# Patient Record
Sex: Female | Born: 1965 | Race: White | Hispanic: No | Marital: Married | Smoking: Never smoker
Health system: Southern US, Community
[De-identification: ages and names within clinical notes are randomized; demographics above are authoritative.]

## PROBLEM LIST (undated history)

## (undated) HISTORY — PX: BREAST CYST ASPIRATION: SHX578

---

## 1997-09-19 ENCOUNTER — Inpatient Hospital Stay (HOSPITAL_COMMUNITY): Admission: AD | Admit: 1997-09-19 | Discharge: 1997-09-21 | Payer: Self-pay | Admitting: Obstetrics and Gynecology

## 1997-10-16 ENCOUNTER — Other Ambulatory Visit: Admission: RE | Admit: 1997-10-16 | Discharge: 1997-10-16 | Payer: Self-pay | Admitting: Obstetrics and Gynecology

## 1998-12-20 ENCOUNTER — Other Ambulatory Visit: Admission: RE | Admit: 1998-12-20 | Discharge: 1998-12-20 | Payer: Self-pay | Admitting: Obstetrics and Gynecology

## 2000-02-13 ENCOUNTER — Other Ambulatory Visit: Admission: RE | Admit: 2000-02-13 | Discharge: 2000-02-13 | Payer: Self-pay | Admitting: Obstetrics and Gynecology

## 2001-03-07 ENCOUNTER — Other Ambulatory Visit: Admission: RE | Admit: 2001-03-07 | Discharge: 2001-03-07 | Payer: Self-pay | Admitting: Obstetrics and Gynecology

## 2001-07-08 ENCOUNTER — Ambulatory Visit (HOSPITAL_COMMUNITY): Admission: RE | Admit: 2001-07-08 | Discharge: 2001-07-08 | Payer: Self-pay | Admitting: Family Medicine

## 2001-07-08 ENCOUNTER — Encounter: Payer: Self-pay | Admitting: Family Medicine

## 2001-09-23 ENCOUNTER — Encounter: Payer: Self-pay | Admitting: Family Medicine

## 2001-09-23 ENCOUNTER — Ambulatory Visit (HOSPITAL_COMMUNITY): Admission: RE | Admit: 2001-09-23 | Discharge: 2001-09-23 | Payer: Self-pay | Admitting: Family Medicine

## 2001-10-12 ENCOUNTER — Encounter: Payer: Self-pay | Admitting: Urology

## 2001-10-12 ENCOUNTER — Encounter: Admission: RE | Admit: 2001-10-12 | Discharge: 2001-10-12 | Payer: Self-pay | Admitting: Urology

## 2001-10-27 ENCOUNTER — Encounter: Payer: Self-pay | Admitting: Urology

## 2001-10-27 ENCOUNTER — Ambulatory Visit (HOSPITAL_BASED_OUTPATIENT_CLINIC_OR_DEPARTMENT_OTHER): Admission: RE | Admit: 2001-10-27 | Discharge: 2001-10-27 | Payer: Self-pay | Admitting: Urology

## 2002-06-30 ENCOUNTER — Other Ambulatory Visit: Admission: RE | Admit: 2002-06-30 | Discharge: 2002-06-30 | Payer: Self-pay | Admitting: Obstetrics and Gynecology

## 2003-09-18 ENCOUNTER — Other Ambulatory Visit: Admission: RE | Admit: 2003-09-18 | Discharge: 2003-09-18 | Payer: Self-pay | Admitting: Obstetrics and Gynecology

## 2003-10-12 ENCOUNTER — Emergency Department (HOSPITAL_COMMUNITY): Admission: EM | Admit: 2003-10-12 | Discharge: 2003-10-12 | Payer: Self-pay | Admitting: Emergency Medicine

## 2003-10-19 DIAGNOSIS — D229 Melanocytic nevi, unspecified: Secondary | ICD-10-CM

## 2003-10-19 HISTORY — DX: Melanocytic nevi, unspecified: D22.9

## 2005-01-30 ENCOUNTER — Other Ambulatory Visit: Admission: RE | Admit: 2005-01-30 | Discharge: 2005-01-30 | Payer: Self-pay | Admitting: Obstetrics and Gynecology

## 2005-02-16 ENCOUNTER — Encounter: Admission: RE | Admit: 2005-02-16 | Discharge: 2005-02-16 | Payer: Self-pay | Admitting: Obstetrics and Gynecology

## 2005-08-28 ENCOUNTER — Encounter: Admission: RE | Admit: 2005-08-28 | Discharge: 2005-08-28 | Payer: Self-pay | Admitting: Obstetrics and Gynecology

## 2006-02-17 ENCOUNTER — Encounter: Admission: RE | Admit: 2006-02-17 | Discharge: 2006-02-17 | Payer: Self-pay | Admitting: Obstetrics and Gynecology

## 2007-05-13 ENCOUNTER — Encounter: Admission: RE | Admit: 2007-05-13 | Discharge: 2007-05-13 | Payer: Self-pay | Admitting: Obstetrics and Gynecology

## 2008-04-13 ENCOUNTER — Ambulatory Visit: Payer: Self-pay | Admitting: Urology

## 2008-05-18 ENCOUNTER — Encounter: Admission: RE | Admit: 2008-05-18 | Discharge: 2008-05-18 | Payer: Self-pay | Admitting: Obstetrics and Gynecology

## 2009-03-22 ENCOUNTER — Encounter: Admission: RE | Admit: 2009-03-22 | Discharge: 2009-03-22 | Payer: Self-pay | Admitting: Obstetrics and Gynecology

## 2009-04-19 ENCOUNTER — Ambulatory Visit: Payer: Self-pay | Admitting: Urology

## 2009-05-24 ENCOUNTER — Encounter: Admission: RE | Admit: 2009-05-24 | Discharge: 2009-05-24 | Payer: Self-pay | Admitting: Obstetrics and Gynecology

## 2010-04-05 ENCOUNTER — Encounter: Payer: Self-pay | Admitting: Obstetrics and Gynecology

## 2010-05-09 ENCOUNTER — Other Ambulatory Visit: Payer: Self-pay | Admitting: Obstetrics and Gynecology

## 2010-05-09 DIAGNOSIS — Z1231 Encounter for screening mammogram for malignant neoplasm of breast: Secondary | ICD-10-CM

## 2010-05-30 ENCOUNTER — Ambulatory Visit
Admission: RE | Admit: 2010-05-30 | Discharge: 2010-05-30 | Disposition: A | Discharge status: Home / Self Care | Payer: BC Managed Care – PPO | Source: Ambulatory Visit | Attending: Obstetrics and Gynecology | Admitting: Obstetrics and Gynecology

## 2010-05-30 DIAGNOSIS — Z1231 Encounter for screening mammogram for malignant neoplasm of breast: Secondary | ICD-10-CM

## 2010-06-02 ENCOUNTER — Other Ambulatory Visit: Payer: Self-pay | Admitting: Obstetrics and Gynecology

## 2010-06-02 DIAGNOSIS — R928 Other abnormal and inconclusive findings on diagnostic imaging of breast: Secondary | ICD-10-CM

## 2010-06-06 ENCOUNTER — Ambulatory Visit
Admission: RE | Admit: 2010-06-06 | Discharge: 2010-06-06 | Disposition: A | Discharge status: Home / Self Care | Payer: BC Managed Care – PPO | Source: Ambulatory Visit | Attending: Obstetrics and Gynecology | Admitting: Obstetrics and Gynecology

## 2010-06-06 DIAGNOSIS — R928 Other abnormal and inconclusive findings on diagnostic imaging of breast: Secondary | ICD-10-CM

## 2011-05-06 ENCOUNTER — Other Ambulatory Visit: Payer: Self-pay | Admitting: Obstetrics and Gynecology

## 2011-05-06 DIAGNOSIS — Z1231 Encounter for screening mammogram for malignant neoplasm of breast: Secondary | ICD-10-CM

## 2011-06-03 ENCOUNTER — Ambulatory Visit
Admission: RE | Admit: 2011-06-03 | Discharge: 2011-06-03 | Disposition: A | Discharge status: Home / Self Care | Payer: BC Managed Care – PPO | Source: Ambulatory Visit | Attending: Obstetrics and Gynecology | Admitting: Obstetrics and Gynecology

## 2011-06-03 DIAGNOSIS — Z1231 Encounter for screening mammogram for malignant neoplasm of breast: Secondary | ICD-10-CM

## 2012-06-15 ENCOUNTER — Other Ambulatory Visit: Payer: Self-pay

## 2012-06-15 DIAGNOSIS — Z1231 Encounter for screening mammogram for malignant neoplasm of breast: Secondary | ICD-10-CM

## 2012-07-20 ENCOUNTER — Ambulatory Visit
Admission: RE | Admit: 2012-07-20 | Discharge: 2012-07-20 | Disposition: A | Discharge status: Home / Self Care | Payer: BC Managed Care – PPO | Source: Ambulatory Visit

## 2012-07-20 DIAGNOSIS — Z1231 Encounter for screening mammogram for malignant neoplasm of breast: Secondary | ICD-10-CM

## 2013-11-03 ENCOUNTER — Other Ambulatory Visit: Payer: Self-pay

## 2013-11-03 DIAGNOSIS — Z1231 Encounter for screening mammogram for malignant neoplasm of breast: Secondary | ICD-10-CM

## 2013-11-29 ENCOUNTER — Ambulatory Visit: Payer: BC Managed Care – PPO

## 2014-01-26 ENCOUNTER — Ambulatory Visit
Admission: RE | Admit: 2014-01-26 | Discharge: 2014-01-26 | Disposition: A | Discharge status: Home / Self Care | Payer: BC Managed Care – PPO | Source: Ambulatory Visit

## 2014-01-26 DIAGNOSIS — Z1231 Encounter for screening mammogram for malignant neoplasm of breast: Secondary | ICD-10-CM

## 2014-01-29 ENCOUNTER — Other Ambulatory Visit: Payer: Self-pay | Admitting: Obstetrics and Gynecology

## 2014-01-29 DIAGNOSIS — R928 Other abnormal and inconclusive findings on diagnostic imaging of breast: Secondary | ICD-10-CM

## 2014-02-14 ENCOUNTER — Ambulatory Visit
Admission: RE | Admit: 2014-02-14 | Discharge: 2014-02-14 | Disposition: A | Discharge status: Home / Self Care | Payer: BC Managed Care – PPO | Source: Ambulatory Visit | Attending: Obstetrics and Gynecology | Admitting: Obstetrics and Gynecology

## 2014-02-14 DIAGNOSIS — R928 Other abnormal and inconclusive findings on diagnostic imaging of breast: Secondary | ICD-10-CM

## 2015-06-07 ENCOUNTER — Other Ambulatory Visit: Payer: Self-pay

## 2015-06-07 DIAGNOSIS — Z1231 Encounter for screening mammogram for malignant neoplasm of breast: Secondary | ICD-10-CM

## 2015-07-05 ENCOUNTER — Ambulatory Visit
Admission: RE | Admit: 2015-07-05 | Discharge: 2015-07-05 | Disposition: A | Discharge status: Home / Self Care | Payer: BLUE CROSS/BLUE SHIELD | Source: Ambulatory Visit

## 2015-07-05 DIAGNOSIS — Z1231 Encounter for screening mammogram for malignant neoplasm of breast: Secondary | ICD-10-CM

## 2016-08-26 ENCOUNTER — Other Ambulatory Visit: Payer: Self-pay | Admitting: Obstetrics and Gynecology

## 2016-08-26 DIAGNOSIS — Z1231 Encounter for screening mammogram for malignant neoplasm of breast: Secondary | ICD-10-CM

## 2016-08-28 ENCOUNTER — Ambulatory Visit
Admission: RE | Admit: 2016-08-28 | Discharge: 2016-08-28 | Disposition: A | Discharge status: Home / Self Care | Payer: BLUE CROSS/BLUE SHIELD | Source: Ambulatory Visit | Attending: Obstetrics and Gynecology | Admitting: Obstetrics and Gynecology

## 2016-08-28 DIAGNOSIS — Z1231 Encounter for screening mammogram for malignant neoplasm of breast: Secondary | ICD-10-CM

## 2017-10-01 ENCOUNTER — Other Ambulatory Visit: Payer: Self-pay | Admitting: Obstetrics and Gynecology

## 2017-10-01 DIAGNOSIS — Z1231 Encounter for screening mammogram for malignant neoplasm of breast: Secondary | ICD-10-CM

## 2017-10-27 ENCOUNTER — Ambulatory Visit
Admission: RE | Admit: 2017-10-27 | Discharge: 2017-10-27 | Disposition: A | Discharge status: Home / Self Care | Payer: BLUE CROSS/BLUE SHIELD | Source: Ambulatory Visit | Attending: Obstetrics and Gynecology | Admitting: Obstetrics and Gynecology

## 2017-10-27 DIAGNOSIS — Z1231 Encounter for screening mammogram for malignant neoplasm of breast: Secondary | ICD-10-CM

## 2018-08-30 ENCOUNTER — Telehealth: Payer: Self-pay

## 2018-08-30 NOTE — Telephone Encounter (Signed)
Error

## 2019-06-19 ENCOUNTER — Other Ambulatory Visit: Payer: Self-pay | Admitting: Obstetrics and Gynecology

## 2019-06-19 DIAGNOSIS — Z1231 Encounter for screening mammogram for malignant neoplasm of breast: Secondary | ICD-10-CM

## 2019-06-28 ENCOUNTER — Other Ambulatory Visit: Payer: Self-pay

## 2019-06-28 ENCOUNTER — Ambulatory Visit
Admission: RE | Admit: 2019-06-28 | Discharge: 2019-06-28 | Disposition: A | Discharge status: Home / Self Care | Payer: BC Managed Care – PPO | Source: Ambulatory Visit | Attending: Obstetrics and Gynecology | Admitting: Obstetrics and Gynecology

## 2019-06-28 DIAGNOSIS — Z1231 Encounter for screening mammogram for malignant neoplasm of breast: Secondary | ICD-10-CM

## 2019-06-29 ENCOUNTER — Other Ambulatory Visit: Payer: Self-pay | Admitting: Obstetrics and Gynecology

## 2019-06-29 DIAGNOSIS — R928 Other abnormal and inconclusive findings on diagnostic imaging of breast: Secondary | ICD-10-CM

## 2019-07-05 ENCOUNTER — Ambulatory Visit: Payer: BC Managed Care – PPO

## 2019-07-05 ENCOUNTER — Ambulatory Visit
Admission: RE | Admit: 2019-07-05 | Discharge: 2019-07-05 | Disposition: A | Discharge status: Home / Self Care | Payer: BC Managed Care – PPO | Source: Ambulatory Visit | Attending: Obstetrics and Gynecology | Admitting: Obstetrics and Gynecology

## 2019-07-05 ENCOUNTER — Other Ambulatory Visit: Payer: Self-pay

## 2019-07-05 DIAGNOSIS — R928 Other abnormal and inconclusive findings on diagnostic imaging of breast: Secondary | ICD-10-CM

## 2019-08-04 ENCOUNTER — Encounter: Payer: Self-pay | Admitting: Physician Assistant

## 2019-08-04 ENCOUNTER — Other Ambulatory Visit: Payer: Self-pay

## 2019-08-04 ENCOUNTER — Ambulatory Visit: Payer: BC Managed Care – PPO | Admitting: Physician Assistant

## 2019-08-04 DIAGNOSIS — D2339 Other benign neoplasm of skin of other parts of face: Secondary | ICD-10-CM | POA: Diagnosis not present

## 2019-08-04 DIAGNOSIS — L578 Other skin changes due to chronic exposure to nonionizing radiation: Secondary | ICD-10-CM

## 2019-08-04 DIAGNOSIS — D239 Other benign neoplasm of skin, unspecified: Secondary | ICD-10-CM

## 2019-08-04 DIAGNOSIS — Z86018 Personal history of other benign neoplasm: Secondary | ICD-10-CM | POA: Diagnosis not present

## 2019-08-04 DIAGNOSIS — Z1283 Encounter for screening for malignant neoplasm of skin: Secondary | ICD-10-CM | POA: Diagnosis not present

## 2019-08-04 NOTE — Patient Instructions (Signed)

## 2019-08-04 NOTE — Progress Notes (Signed)
   Follow-Up Visit   Subjective  Wendy Bradshaw is a 54 y.o. female who presents for the following: Skin Problem (spot has come back outer right eye and also needs skin check).   The following portions of the chart were reviewed this encounter and updated as appropriate:     Objective  Well appearing patient in no apparent distress; mood and affect are within normal limits.  A full examination was performed including scalp, head, eyes, ears, nose, lips, neck, chest, axillae, abdomen, back, buttocks, bilateral upper extremities, bilateral lower extremities, hands, feet, fingers, toes, fingernails, and toenails. All findings within normal limits unless otherwise noted below.  Objective  Chest - Medial [facility]), Left Eye, Left Upper Arm - Anterior, Mid Forehead, Right Upper Arm - Anterior: Diffuse xerosis  Objective  Left Proximal 4th Finger: All scars clear  Objective  Right Temple: Translucent raised soft papule 5-0 x2 suture nylon  Images    Objective  Head - to toe: No DN or signs of NMSC   Assessment & Plan  Actinic skin damage (5) Left Eye; Left Upper Arm - Anterior; Right Upper Arm - Anterior; Chest - Medial [facility]); Mid Forehead  observe  History of dysplastic nevus Left Proximal 4th Finger  Yearly skin exams  Hydrocystoma Right Temple  Skin / nail biopsy - Right Temple Type of biopsy: tangential   Informed consent: discussed and consent obtained   Timeout: patient name, date of birth, surgical site, and procedure verified   Anesthesia: the lesion was anesthetized in a standard fashion   Anesthetic:  1% lidocaine w/ epinephrine 1-100,000 local infiltration Instrument used: scissors   Hemostasis achieved with: aluminum chloride and electrodesiccation   Outcome: patient tolerated procedure well   Post-procedure details: wound care instructions given    Skin repair - Right Temple Complexity:  Simple Informed consent: discussed and consent  obtained   Timeout: patient name, date of birth, surgical site, and procedure verified   Procedure prep:  Patient was prepped and draped in usual sterile fashion (non sterile) Prep type:  Chlorhexidine Anesthesia: the lesion was anesthetized in a standard fashion   Undermining: edges undermined   Fine/surface layer approximation (top stitches):  Suture size:  5-0 Suture type: Prolene (polypropylene)   Suture type comment:  Nylon Stitches: simple interrupted   Suture removal (days):  7 Hemostasis achieved with: suture, pressure and electrodesiccation Outcome: patient tolerated procedure well with no complications   Post-procedure details: wound care instructions given   Post-procedure details comment:  Non sterile pressure   Specimen 1 - Surgical pathology Differential Diagnosis: hydrocystoma Check Margins: No Translucent raised soft papule  Screening exam for skin cancer Head - to toe  Skin exams

## 2019-08-04 NOTE — Progress Notes (Signed)
Patient stated she would have her place of employment to remove x2 sutures in one week.

## 2019-08-11 ENCOUNTER — Other Ambulatory Visit: Payer: Self-pay

## 2019-08-11 ENCOUNTER — Ambulatory Visit (INDEPENDENT_AMBULATORY_CARE_PROVIDER_SITE_OTHER): Payer: BC Managed Care – PPO

## 2019-08-11 DIAGNOSIS — Z4802 Encounter for removal of sutures: Secondary | ICD-10-CM

## 2019-08-11 NOTE — Progress Notes (Signed)
PATHOLOGY GIVEN TO PATIENT NO SIGN OR SYMPTOMS OF INFECTION.

## 2020-09-19 ENCOUNTER — Other Ambulatory Visit: Payer: Self-pay | Admitting: Obstetrics and Gynecology

## 2020-09-19 DIAGNOSIS — Z1231 Encounter for screening mammogram for malignant neoplasm of breast: Secondary | ICD-10-CM

## 2020-11-13 ENCOUNTER — Other Ambulatory Visit: Payer: Self-pay

## 2020-11-13 ENCOUNTER — Ambulatory Visit
Admission: RE | Admit: 2020-11-13 | Discharge: 2020-11-13 | Disposition: A | Discharge status: Home / Self Care | Payer: BC Managed Care – PPO | Source: Ambulatory Visit | Attending: Obstetrics and Gynecology | Admitting: Obstetrics and Gynecology

## 2020-11-13 DIAGNOSIS — Z1231 Encounter for screening mammogram for malignant neoplasm of breast: Secondary | ICD-10-CM

## 2021-05-30 IMAGING — MG DIGITAL SCREENING BILAT W/ TOMO W/ CAD
8 series · 9 of 24 positions shown · non-contrast
Comparison: Previous exam(s).

CLINICAL DATA: Screening.

EXAM:
DIGITAL SCREENING BILATERAL MAMMOGRAM WITH TOMO AND CAD

[L MLO synth-2D]
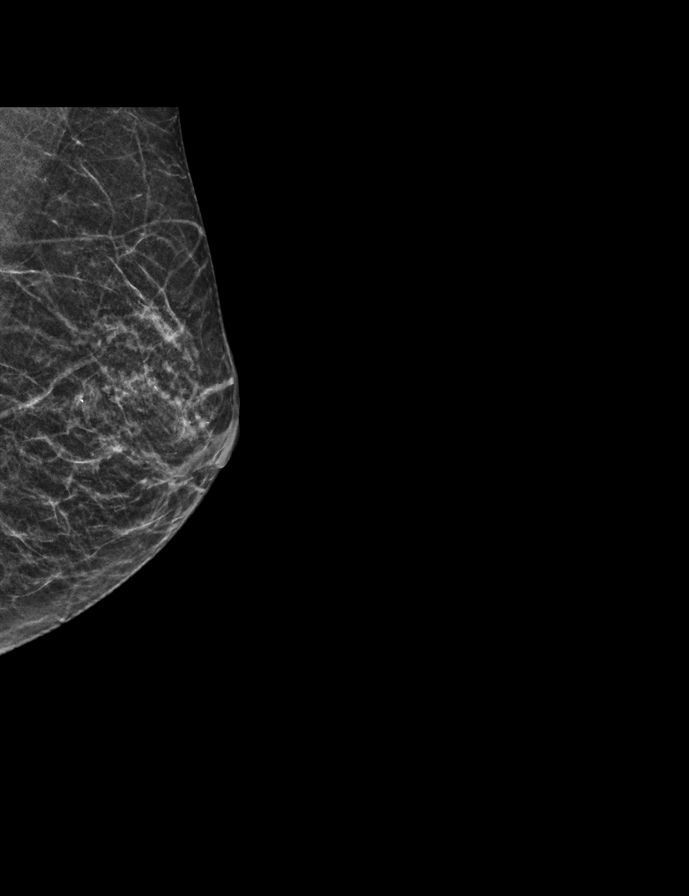

[R CC synth-2D]
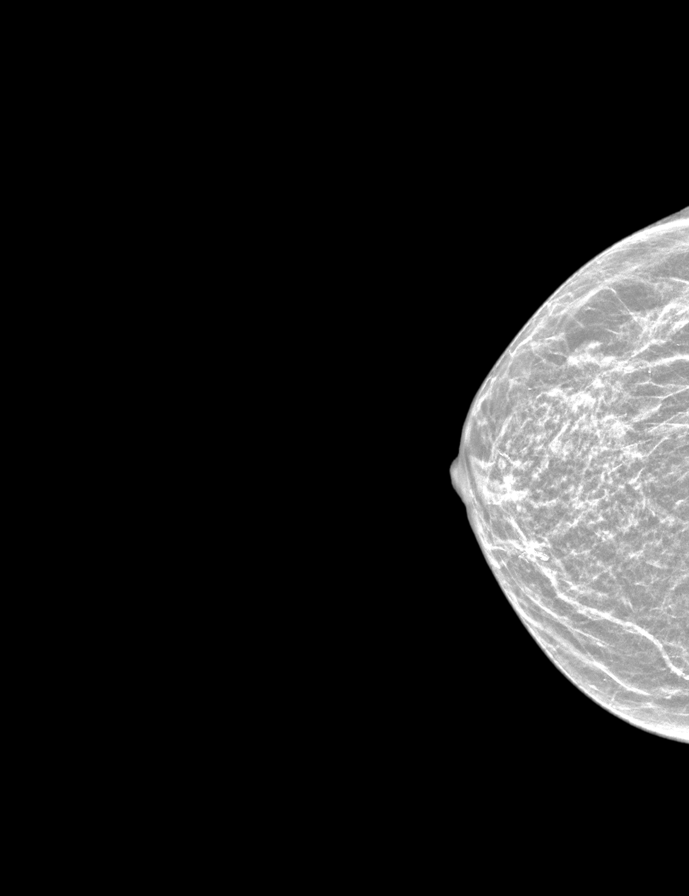

[R MLO synth-2D]
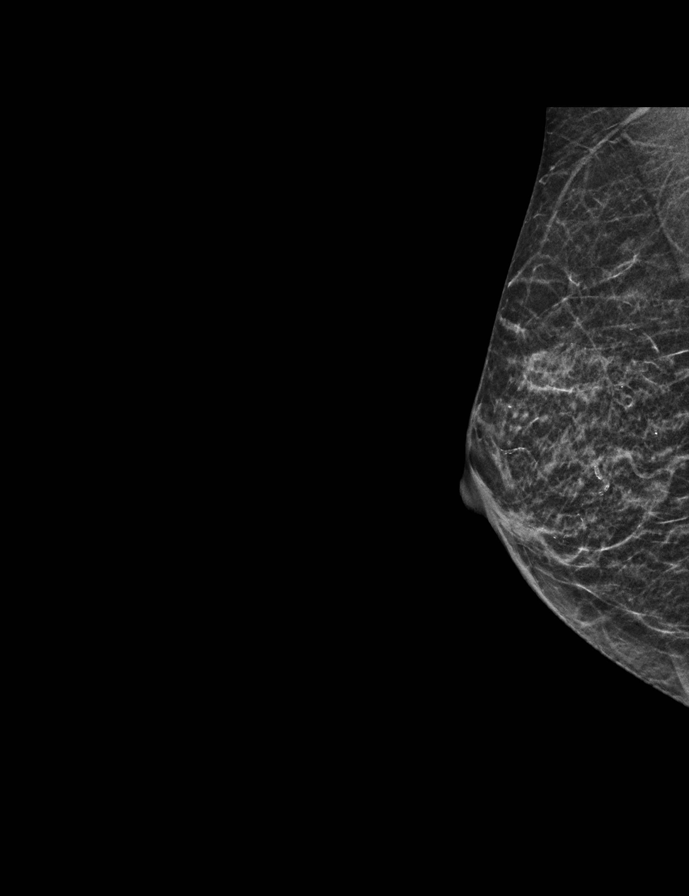

[L CC synth-2D]
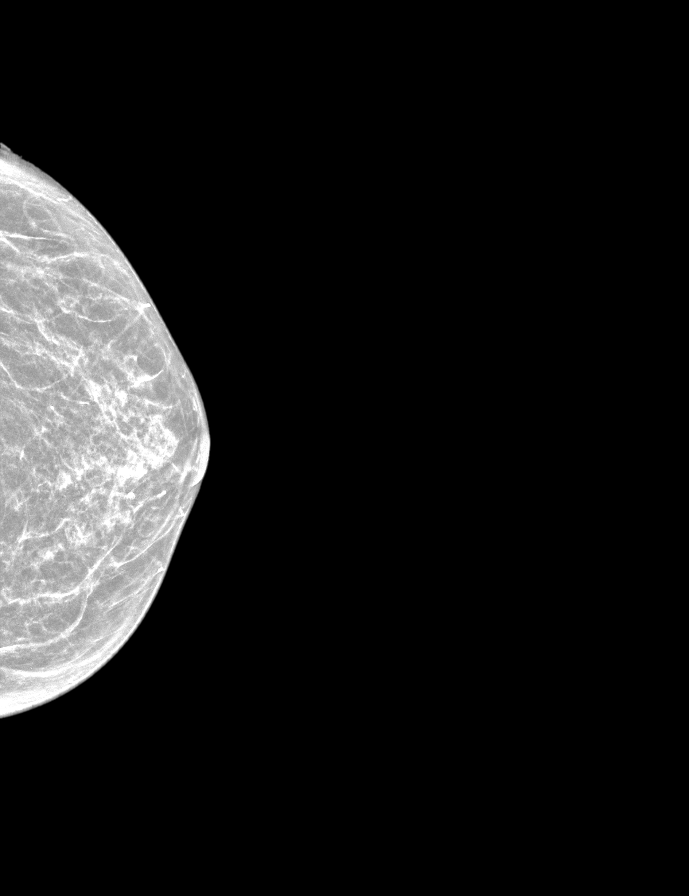

[L CC tomo · 2 of 36 frames shown]
[frame 12/36]
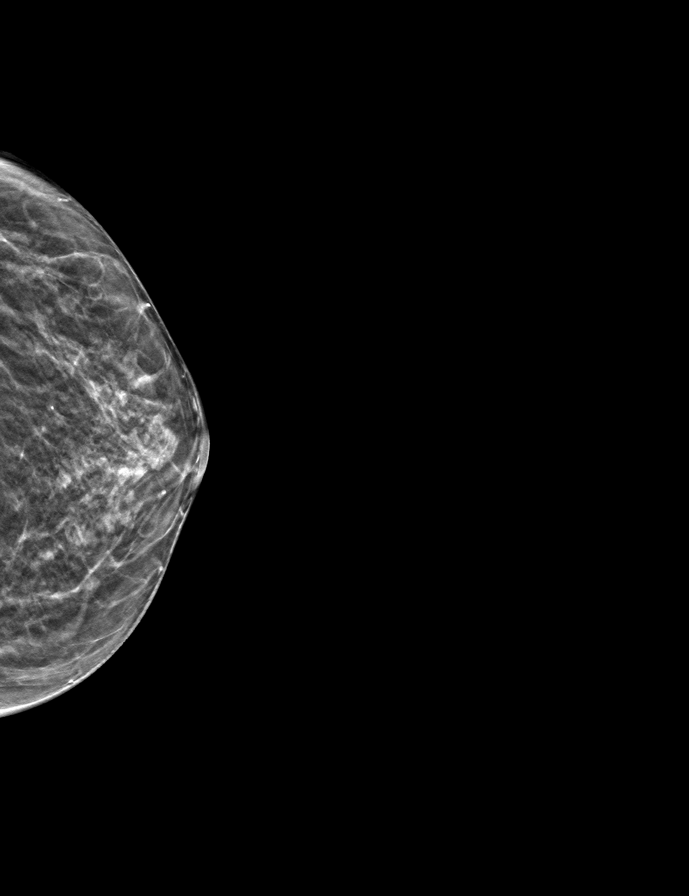
[frame 19/36]
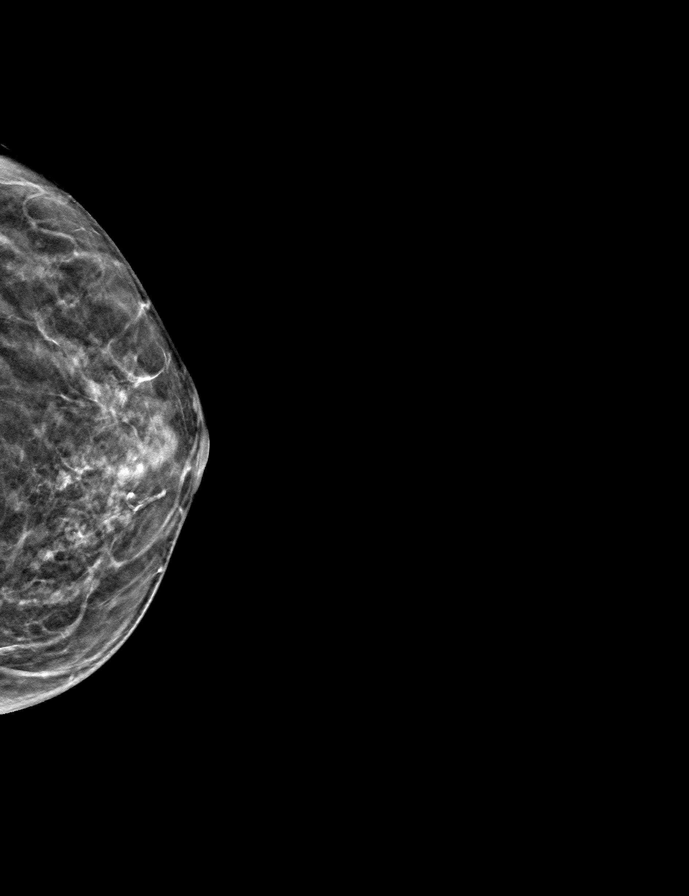

[R MLO tomo · tomo slice 15/30.0]
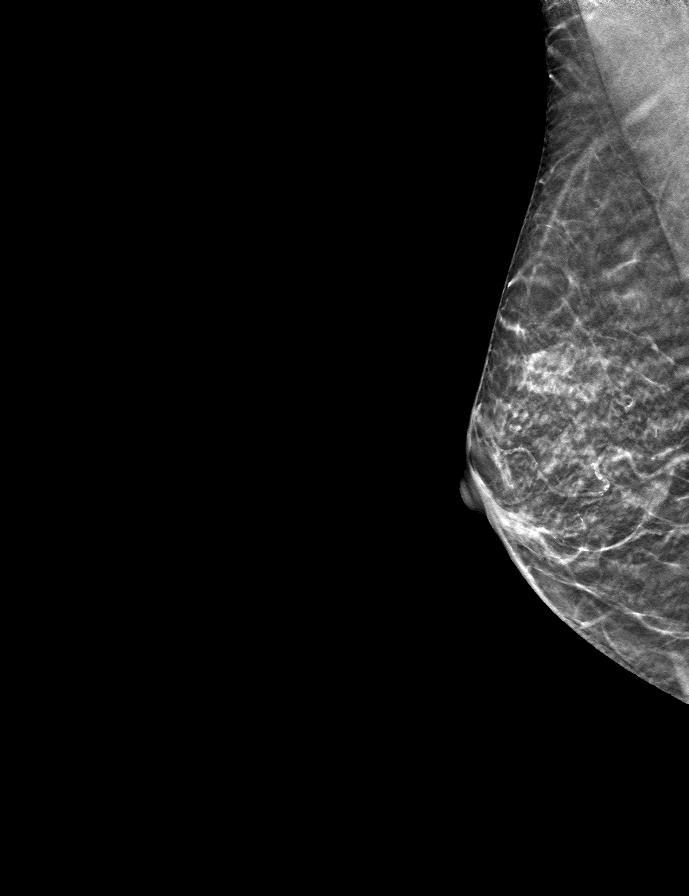

[R CC tomo · tomo slice 17/33.0]
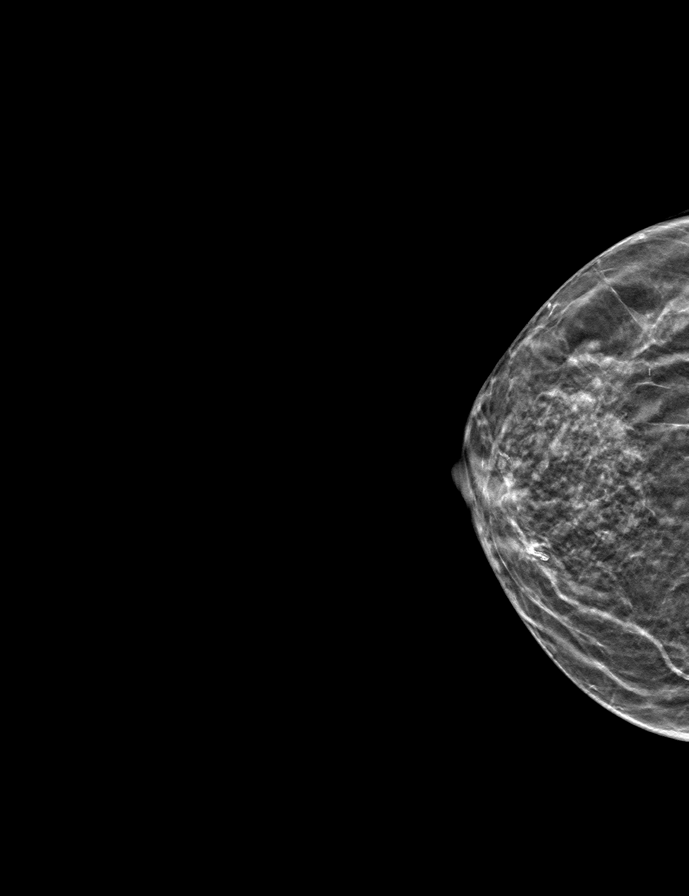

[L MLO tomo · tomo slice 16/31.0]
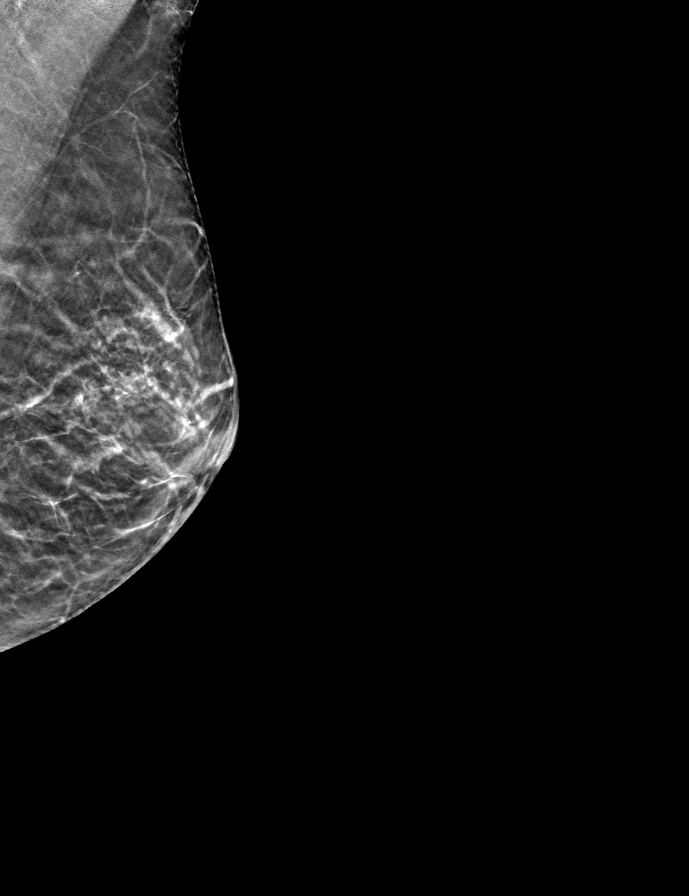

[9 of 24 positions shown; findings below may reference images not displayed]

ACR Breast Density Category b: There are scattered areas of
fibroglandular density.
FINDINGS: In the right breast, a possible asymmetry warrants further
evaluation. In the left breast, no findings suspicious for
malignancy. Images were processed with CAD.
IMPRESSION: Further evaluation is suggested for possible asymmetry in the right
breast.

RECOMMENDATION:
Diagnostic mammogram and possibly ultrasound of the right breast.
(Code:[id number])

The patient will be contacted regarding the findings, and additional
imaging will be scheduled.

BI-RADS CATEGORY  0: Incomplete. Need additional imaging evaluation
and/or prior mammograms for comparison.

## 2022-07-31 ENCOUNTER — Other Ambulatory Visit: Payer: Self-pay | Admitting: Obstetrics and Gynecology

## 2022-07-31 DIAGNOSIS — Z1231 Encounter for screening mammogram for malignant neoplasm of breast: Secondary | ICD-10-CM

## 2022-08-26 ENCOUNTER — Ambulatory Visit
Admission: RE | Admit: 2022-08-26 | Discharge: 2022-08-26 | Disposition: A | Discharge status: Home / Self Care | Payer: BC Managed Care – PPO | Source: Ambulatory Visit | Attending: Obstetrics and Gynecology | Admitting: Obstetrics and Gynecology

## 2022-08-26 DIAGNOSIS — Z1231 Encounter for screening mammogram for malignant neoplasm of breast: Secondary | ICD-10-CM

## 2022-08-28 ENCOUNTER — Other Ambulatory Visit: Payer: Self-pay | Admitting: Obstetrics and Gynecology

## 2022-08-28 DIAGNOSIS — R928 Other abnormal and inconclusive findings on diagnostic imaging of breast: Secondary | ICD-10-CM

## 2022-09-02 ENCOUNTER — Other Ambulatory Visit: Payer: Self-pay | Admitting: Obstetrics and Gynecology

## 2022-09-02 DIAGNOSIS — Z8249 Family history of ischemic heart disease and other diseases of the circulatory system: Secondary | ICD-10-CM

## 2022-09-09 ENCOUNTER — Ambulatory Visit
Admission: RE | Admit: 2022-09-09 | Discharge: 2022-09-09 | Disposition: A | Discharge status: Home / Self Care | Payer: BC Managed Care – PPO | Source: Ambulatory Visit | Attending: Obstetrics and Gynecology | Admitting: Obstetrics and Gynecology

## 2022-09-09 ENCOUNTER — Other Ambulatory Visit: Payer: Self-pay | Admitting: Obstetrics and Gynecology

## 2022-09-09 DIAGNOSIS — R928 Other abnormal and inconclusive findings on diagnostic imaging of breast: Secondary | ICD-10-CM

## 2022-10-07 LAB — COLOGUARD: COLOGUARD: NEGATIVE
